# Patient Record
Sex: Female | Born: 1955 | Race: White | Hispanic: No | Marital: Married | Smoking: Never smoker
Health system: Southern US, Community
[De-identification: ages and names within clinical notes are randomized; demographics above are authoritative.]

## PROBLEM LIST (undated history)

## (undated) DIAGNOSIS — T7840XA Allergy, unspecified, initial encounter: Secondary | ICD-10-CM

## (undated) HISTORY — DX: Allergy, unspecified, initial encounter: T78.40XA

---

## 2014-12-25 ENCOUNTER — Telehealth: Payer: Self-pay | Admitting: Physician Assistant

## 2014-12-25 NOTE — Telephone Encounter (Signed)
Caller name:Dr barbour from [facility]  Call back number: [phone]    Reason for call:  Cynthia Gaines scheduled a new patient appointment for Cynthia Gaines for 12/26/14/ Cynthia Gaines would like to speak with you before 12:30 if possible.

## 2014-12-25 NOTE — Telephone Encounter (Signed)
Unfortunately I am out office today 12/25/14 and will not return until tomorrow 12/26/14.  I will attempt to call Dr. Georgeanne NimBarbour tomorrow morning.

## 2014-12-26 ENCOUNTER — Ambulatory Visit (HOSPITAL_BASED_OUTPATIENT_CLINIC_OR_DEPARTMENT_OTHER)
Admission: RE | Admit: 2014-12-26 | Discharge: 2014-12-26 | Disposition: A | Discharge status: Home / Self Care | Payer: Self-pay | Source: Ambulatory Visit | Attending: Physician Assistant | Admitting: Physician Assistant

## 2014-12-26 ENCOUNTER — Ambulatory Visit (INDEPENDENT_AMBULATORY_CARE_PROVIDER_SITE_OTHER): Payer: Self-pay | Admitting: Physician Assistant

## 2014-12-26 ENCOUNTER — Encounter: Payer: Self-pay | Admitting: Physician Assistant

## 2014-12-26 VITALS — BP 111/91 | HR 68 | Temp 97.7°F | Ht 67.5 in | Wt 164.0 lb

## 2014-12-26 DIAGNOSIS — R0602 Shortness of breath: Secondary | ICD-10-CM | POA: Insufficient documentation

## 2014-12-26 DIAGNOSIS — R195 Other fecal abnormalities: Secondary | ICD-10-CM

## 2014-12-26 DIAGNOSIS — R938 Abnormal findings on diagnostic imaging of other specified body structures: Secondary | ICD-10-CM | POA: Insufficient documentation

## 2014-12-26 LAB — COMPREHENSIVE METABOLIC PANEL
ALT: 19 U/L (ref 0–35)
AST: 18 U/L (ref 0–37)
Albumin: 4.5 g/dL (ref 3.5–5.2)
Alkaline Phosphatase: 65 U/L (ref 39–117)
BILIRUBIN TOTAL: 0.4 mg/dL (ref 0.2–1.2)
BUN: 14 mg/dL (ref 6–23)
CALCIUM: 9.7 mg/dL (ref 8.4–10.5)
CHLORIDE: 100 meq/L (ref 96–112)
CO2: 30 mEq/L (ref 19–32)
Creatinine, Ser: 0.82 mg/dL (ref 0.40–1.20)
GFR: 75.96 mL/min (ref >60.00)
GLUCOSE: 96 mg/dL (ref 70–99)
Potassium: 3.7 mEq/L (ref 3.5–5.1)
Sodium: 136 mEq/L (ref 135–145)
TOTAL PROTEIN: 8 g/dL (ref 6.0–8.3)

## 2014-12-26 LAB — CBC WITH DIFFERENTIAL/PLATELET
BASOS ABS: 0 10*3/uL (ref 0.0–0.1)
BASOS PCT: 0.5 % (ref 0.0–3.0)
Eosinophils Absolute: 0 10*3/uL (ref 0.0–0.7)
Eosinophils Relative: 0.5 % (ref 0.0–5.0)
HEMATOCRIT: 41.6 % (ref 36.0–46.0)
Hemoglobin: 14 g/dL (ref 12.0–15.0)
Lymphocytes Relative: 33.7 % (ref 12.0–46.0)
Lymphs Abs: 2.9 10*3/uL (ref 0.7–4.0)
MCHC: 33.6 g/dL (ref 30.0–36.0)
MCV: 98 fl (ref 78.0–100.0)
MONO ABS: 0.5 10*3/uL (ref 0.1–1.0)
Monocytes Relative: 6.1 % (ref 3.0–12.0)
Neutro Abs: 5 10*3/uL (ref 1.4–7.7)
Neutrophils Relative %: 59.2 % (ref 43.0–77.0)
Platelets: 325 10*3/uL (ref 150.0–400.0)
RBC: 4.25 Mil/uL (ref 3.87–5.11)
RDW: 13.9 % (ref 11.5–15.5)
WBC: 8.5 10*3/uL (ref 4.0–10.5)

## 2014-12-26 LAB — HIGH SENSITIVITY CRP: CRP, High Sensitivity: 2.6 mg/L (ref 0.000–5.000)

## 2014-12-26 NOTE — Telephone Encounter (Signed)
Attempted to Each Dr. Georgeanne NimBarbour this morning but his office is closed on Fridays at present.  Will see patient today and try to reach him Monday.

## 2014-12-26 NOTE — Progress Notes (Signed)
Pre visit review using our clinic review tool, if applicable. No additional management support is needed unless otherwise documented below in the visit note. 

## 2014-12-26 NOTE — Progress Notes (Signed)
Patient presents to clinic today to establish care.  Patient was told by her Chiropractor that she needed to see a Primary medical provider to have a comprehensive blood panel due to her having "gut and mold" problems.  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  Denies nausea or vomiting.  Denies loose stools, tenesmus, melena or hematochezia. Endorses cramping and her stool "seems different".  Endorses previous workup with GI negative for evidence of celiac disease.  Past Medical History  Diagnosis Date  . Allergy     No current outpatient prescriptions on file prior to visit.   No current facility-administered medications on file prior to visit.    No Known Allergies  Family History  Problem Relation Age of Onset  . Diabetes Father     History   Social History  . Marital Status: Married    Spouse Name: N/A  . Number of Children: N/A  . Years of Education: N/A   Social History Main Topics  . Smoking status: Never Smoker   . Smokeless tobacco: Not on file  . Alcohol Use: 0.0 oz/week    0 Standard drinks or equivalent per week     Comment: Wine-daily  . Drug Use: No  . Sexual Activity: Not on file   Other Topics Concern  . None   Social History Narrative  . None   Review of Systems - See HPI.  All other ROS are negative.  BP 111/91 mmHg  Pulse 68  Temp(Src) 97.7 F (36.5 C) (Oral)  Ht 5' 7.5" (1.715 m)  Wt 164 lb (74.39 kg)  BMI 25.29 kg/m2  SpO2 100%  Physical Exam  Constitutional: She is oriented to person, place, and time and well-developed, well-nourished, and in no distress.  HENT:  Head: Normocephalic and atraumatic.  Eyes: Conjunctivae are normal.  Cardiovascular: Normal rate, regular rhythm, normal heart sounds and intact distal pulses.   Pulmonary/Chest: Effort normal and breath sounds normal. No respiratory distress. She has no wheezes. She has no  rales. She exhibits no tenderness.  Neurological: She is alert and oriented to person, place, and time.  Skin: Skin is warm and dry. No rash noted.  Psychiatric: Affect normal.  Vitals reviewed.   Recent Results (from the past 2160 hour(s))  CBC w/Diff     Status: None   Collection Time: 12/26/14  2:03 PM  Result Value Ref Range   WBC 8.5 4.0 - 10.5 K/uL   RBC 4.25 3.87 - 5.11 Mil/uL   Hemoglobin 14.0 12.0 - 15.0 g/dL   HCT 41.6 36.0 - 46.0 %   MCV 98.0 78.0 - 100.0 fl   MCHC 33.6 30.0 - 36.0 g/dL   RDW 13.9 11.5 - 15.5 %   Platelets 325.0 150.0 - 400.0 K/uL   Neutrophils Relative % 59.2 43.0 - 77.0 %   Lymphocytes Relative 33.7 12.0 - 46.0 %   Monocytes Relative 6.1 3.0 - 12.0 %   Eosinophils Relative 0.5 0.0 - 5.0 %   Basophils Relative 0.5 0.0 - 3.0 %   Neutro Abs 5.0 1.4 - 7.7 K/uL   Lymphs Abs 2.9 0.7 - 4.0 K/uL   Monocytes Absolute 0.5 0.1 - 1.0 K/uL   Eosinophils Absolute 0.0 0.0 - 0.7 K/uL   Basophils Absolute 0.0 0.0 - 0.1 K/uL  Comp Met (CMET)     Status: None   Collection Time: 12/26/14  2:03 PM  Result Value Ref Range   Sodium 136 135 - 145 mEq/L   Potassium 3.7 3.5 - 5.1 mEq/L   Chloride 100 96 - 112 mEq/L   CO2 30 19 - 32 mEq/L   Glucose, Bld 96 70 - 99 mg/dL   BUN 14 6 - 23 mg/dL   Creatinine, Ser 0.82 0.40 - 1.20 mg/dL   Total Bilirubin 0.4 0.2 - 1.2 mg/dL   Alkaline Phosphatase 65 39 - 117 U/L   AST 18 0 - 37 U/L   ALT 19 0 - 35 U/L   Total Protein 8.0 6.0 - 8.3 g/dL   Albumin 4.5 3.5 - 5.2 g/dL   Calcium 9.7 8.4 - 10.5 mg/dL   GFR 75.96 >60.00 mL/min  CRP High sensitivity     Status: None   Collection Time: 12/26/14  2:03 PM  Result Value Ref Range   CRP, High Sensitivity 2.600 0.000 - 5.000 mg/L    Comment: Note:  An elevated hs-CRP (>5 mg/L) should be repeated after 2 weeks to rule out recent infection or trauma.    Assessment/Plan: Change in stool Patient is a very poor historian.  Exam unremarkable. Attempted to speak to her Chiropractor  regarding his concerns but he was unavailable. Giving her vague symptoms, not sure if there is an underlying issue or not but will proceed with a workup including CBC, CMP, hs-CRP, CXR and stool studies.

## 2014-12-26 NOTE — Patient Instructions (Signed)
Please go to the lab for blood work.  I will call you with your results. Go downstairs for your chest x-ray. We will proceed with treatment or further workup based on your results. Continue probiotics.  Consider starting a B12 supplement.

## 2014-12-28 DIAGNOSIS — R195 Other fecal abnormalities: Secondary | ICD-10-CM | POA: Insufficient documentation

## 2014-12-28 NOTE — Assessment & Plan Note (Signed)
Patient is a very poor historian.  Exam unremarkable. Attempted to speak to her Chiropractor regarding his concerns but he was unavailable. Giving her vague symptoms, not sure if there is an underlying issue or not but will proceed with a workup including CBC, CMP, hs-CRP, CXR and stool studies.

## 2014-12-29 ENCOUNTER — Telehealth: Payer: Self-pay | Admitting: Physician Assistant

## 2014-12-29 NOTE — Telephone Encounter (Signed)
Relation to pt: self  Call back number: [phone]   Reason for call:  Pt requesting lab results.

## 2014-12-30 NOTE — Telephone Encounter (Signed)
Called and informed pt of recent lab results and note.  Pt verbalized understanding.  Pt requested for lab results to be faxed to Dr. Emerson MonteEarl Barbour.  Results faxed.//AB/CMA

## 2015-01-01 LAB — OVA AND PARASITE EXAMINATION: OP: NONE SEEN

## 2015-01-01 LAB — FECAL LACTOFERRIN, QUANT: LACTOFERRIN: NEGATIVE

## 2015-01-03 LAB — STOOL CULTURE

## 2015-08-27 ENCOUNTER — Telehealth: Payer: Self-pay | Admitting: Behavioral Health

## 2015-08-27 NOTE — Telephone Encounter (Signed)
The following gaps in care were assessed: Mammogram Pap Colon Flu  Attempted to reach patient at both the mobile and home numbers provided. Voicemail was unavailable. Will follow-up.

## 2017-02-21 IMAGING — CR DG CHEST 2V
2 series · 2 of 2 positions shown · non-contrast
Comparison: None.

CLINICAL DATA: 58-year-old female with abnormal prior chest x-ray
(not otherwise specified at the time of this report). Recent
shortness of breath. "Change in stool" . Initial encounter.

EXAM:
CHEST  2 VIEW

[w chest pa]
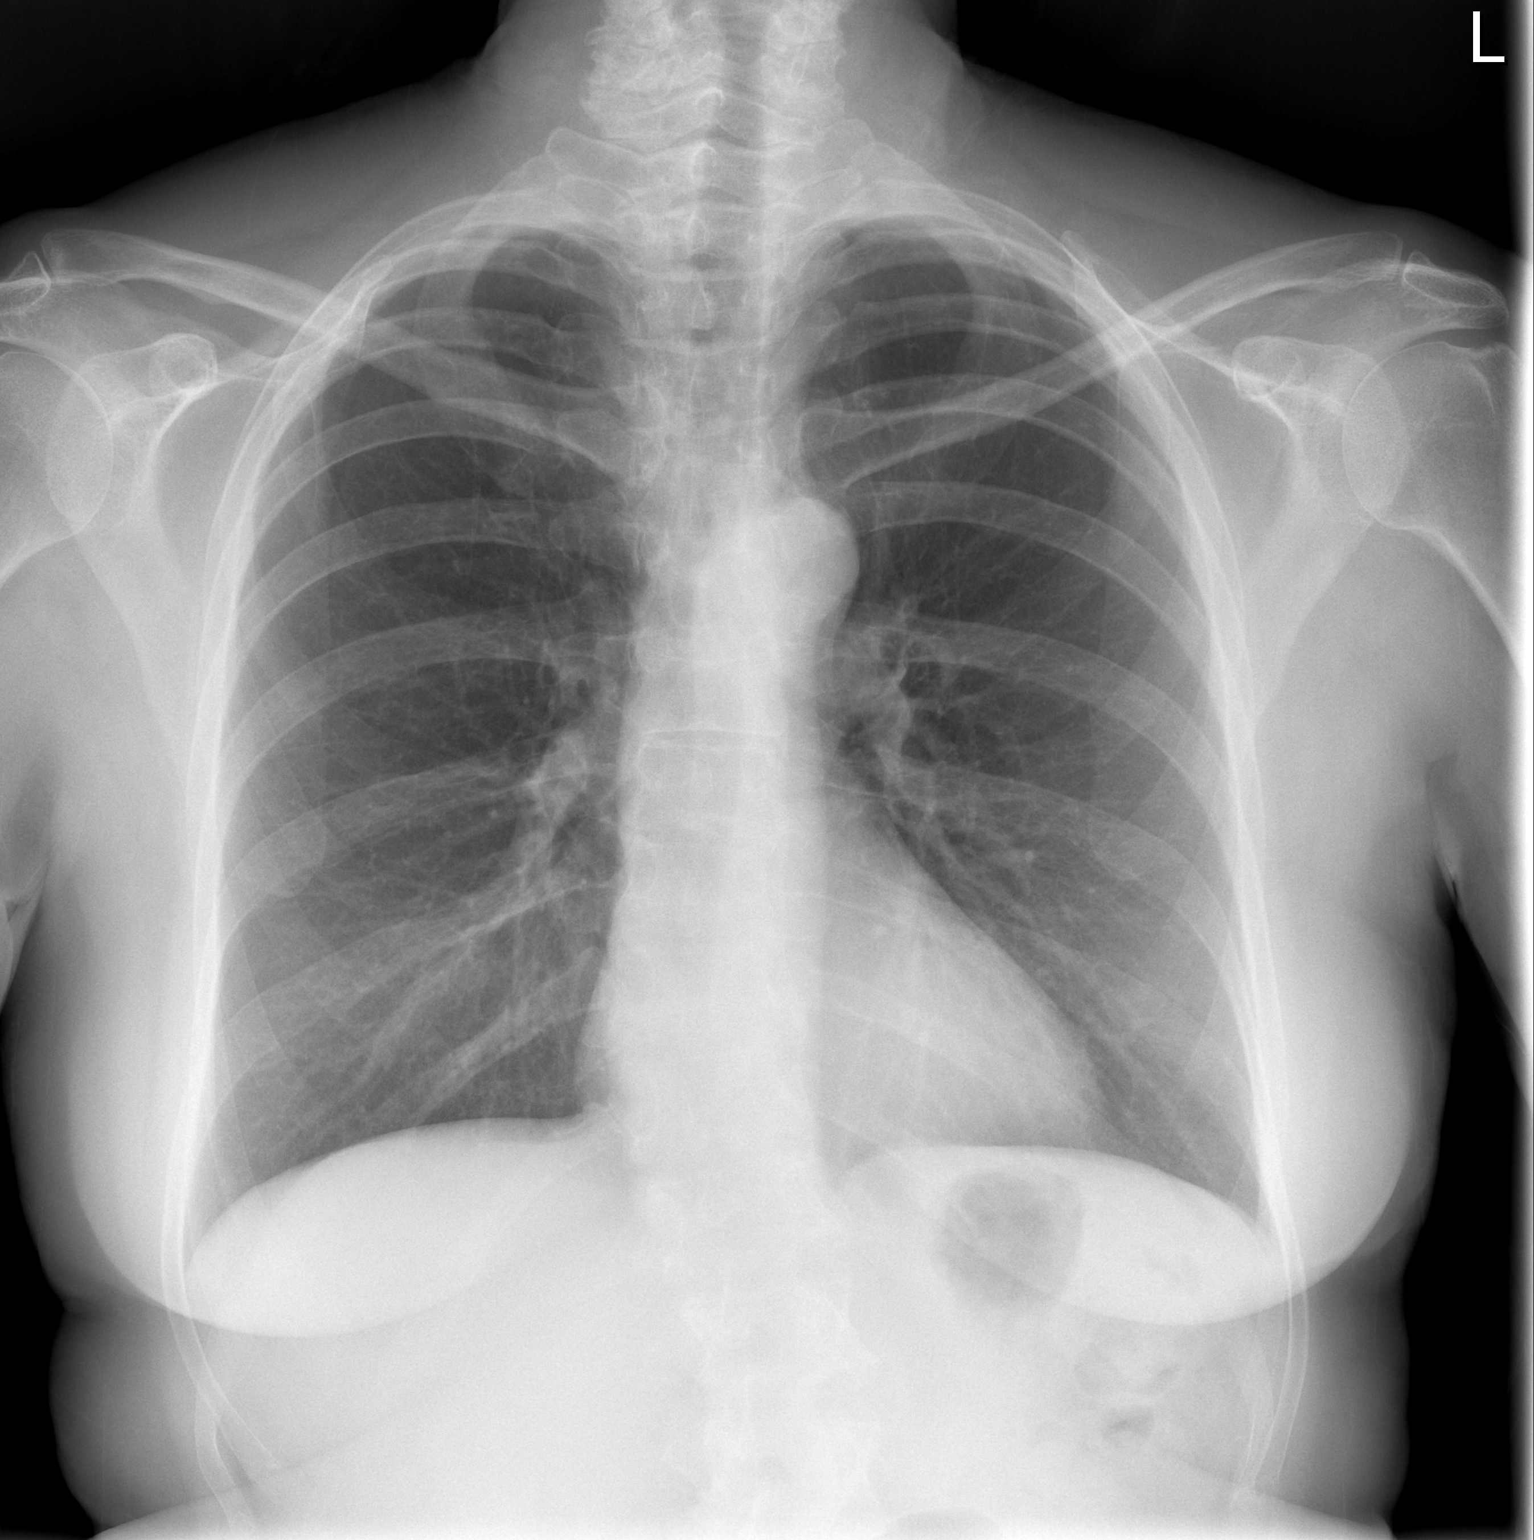

[w chest lat]
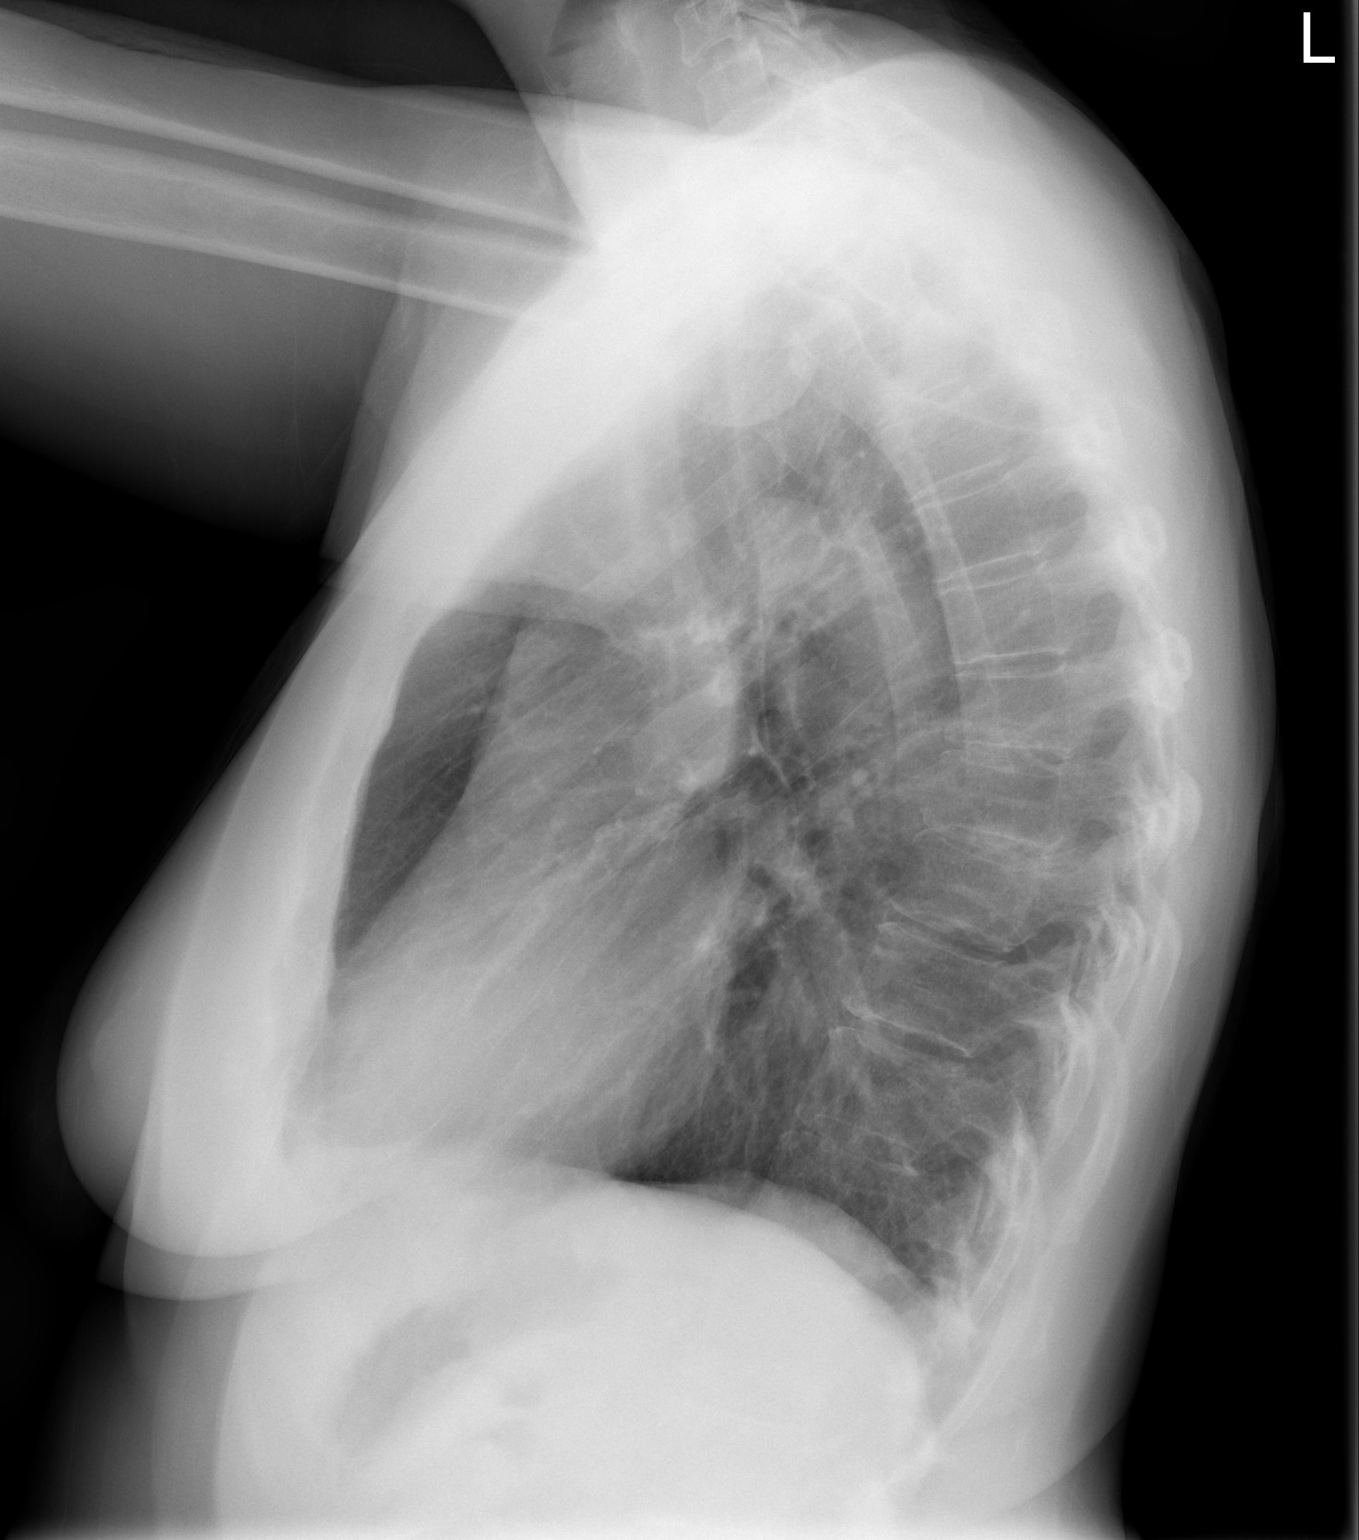

[2 of 2 positions shown; findings below may reference images not displayed]

FINDINGS: Lung volumes are at the upper limits of normal. Normal cardiac size
and mediastinal contours. Visualized tracheal air column is within
normal limits. No pneumothorax, pulmonary edema, pleural effusion or
confluent pulmonary opacity. Mild to moderate thoracolumbar
scoliosis and mildly exaggerated thoracic kyphosis. No acute osseous
abnormality identified.
IMPRESSION: No acute cardiopulmonary abnormality.
